# Patient Record
Sex: Male | Born: 1995 | Race: White | Hispanic: No | Marital: Single | Smoking: Never smoker
Health system: Southern US, Community
[De-identification: ages and names within clinical notes are randomized; demographics above are authoritative.]

---
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
## 2017-05-23 ENCOUNTER — Ambulatory Visit (INDEPENDENT_AMBULATORY_CARE_PROVIDER_SITE_OTHER): Payer: BLUE CROSS/BLUE SHIELD | Admitting: Physician Assistant

## 2017-05-23 VITALS — BP 121/70 | HR 86 | Temp 97.5°F | Ht 70.5 in | Wt 135.8 lb

## 2017-05-23 DIAGNOSIS — J0100 Acute maxillary sinusitis, unspecified: Secondary | ICD-10-CM

## 2017-05-23 MED ORDER — PREDNISONE 10 MG (21) PO TBPK: ORAL_TABLET | ORAL | 0 refills | Status: DC

## 2017-05-23 MED ORDER — AMOXICILLIN 500 MG PO CAPS: 1000.0000 mg | ORAL_CAPSULE | Freq: Two times a day (BID) | ORAL | 0 refills | Status: DC

## 2017-05-23 NOTE — Patient Instructions (Signed)
In a few days you may receive a survey in the mail or online from [employer] regarding your visit with us today. Please take a moment to fill this out. Your feedback is very important to our whole office. It can help us better understand your needs as well as improve your experience and satisfaction. Thank you for taking your time to complete it. We care about you.  Gotti Alwin, PA-C  

## 2017-05-23 NOTE — Progress Notes (Signed)
BP 121/70   Pulse 86   Temp (!) 97.5 F (36.4 C) (Oral)   Ht 5' 10.5" (1.791 m)   Wt 135 lb 12.8 oz (61.6 kg)   BMI 19.21 kg/m    Subjective:    Patient ID: Richard Livingston, male    DOB: 03-Dec-1995, 21 y.o.   MRN: [id number]  HPI: Richard Livingston is a 21 y.o. male presenting on 05/23/2017 for Cough (productive yellow phlegm, no known fever, 3-4 days, Zyrtec, robitusin cold/cough) and Wheezing (not breathing right)  This patient has had many days of sinus headache and postnasal drainage. There is copious drainage at times. Denies any fever at this time. There has been a history of sinus infections in the past.  No history of sinus surgery. There is cough at night. It has become more prevalent in recent days.   Relevant past medical, surgical, family and social history reviewed and updated as indicated. Allergies and medications reviewed and updated.  History reviewed. No pertinent past medical history.  History reviewed. No pertinent surgical history.  Review of Systems  Constitutional: Positive for fatigue. Negative for appetite change and fever.  HENT: Positive for congestion, postnasal drip, sinus pain, sinus pressure and sore throat.   Eyes: Negative.  Negative for pain and visual disturbance.  Respiratory: Positive for wheezing. Negative for cough, chest tightness and shortness of breath.   Cardiovascular: Negative.  Negative for chest pain, palpitations and leg swelling.  Gastrointestinal: Negative.  Negative for abdominal pain, diarrhea, nausea and vomiting.  Endocrine: Negative.   Genitourinary: Negative.   Musculoskeletal: Positive for back pain and myalgias.  Skin: Negative.  Negative for color change and rash.  Neurological: Positive for headaches. Negative for weakness and numbness.  Psychiatric/Behavioral: Negative.     Allergies as of 05/23/2017   No Known Allergies     Medication List       Accurate as of 05/23/17 11:03 AM. Always use your most recent med  list.          amoxicillin 500 MG capsule Commonly known as:  AMOXIL Take 2 capsules (1,000 mg total) by mouth 2 (two) times daily.   predniSONE 10 MG (21) Tbpk tablet Commonly known as:  STERAPRED UNI-PAK 21 TAB As directed x 6 days            Discharge Care Instructions        Start     Ordered   05/23/17 0000  amoxicillin (AMOXIL) 500 MG capsule  2 times daily    Question:  Supervising Provider  Answer:  Elenora Gamma   05/23/17 1102   05/23/17 0000  predniSONE (STERAPRED UNI-PAK 21 TAB) 10 MG (21) TBPK tablet    Question:  Supervising Provider  Answer:  Elenora Gamma   05/23/17 1102         Objective:    BP 121/70   Pulse 86   Temp (!) 97.5 F (36.4 C) (Oral)   Ht 5' 10.5" (1.791 m)   Wt 135 lb 12.8 oz (61.6 kg)   BMI 19.21 kg/m   No Known Allergies  Physical Exam  Constitutional: He is oriented to person, place, and time. He appears well-developed and well-nourished.  HENT:  Head: Normocephalic and atraumatic.  Right Ear: Tympanic membrane and external ear normal. No middle ear effusion.  Left Ear: Tympanic membrane and external ear normal.  No middle ear effusion.  Nose: Mucosal edema and rhinorrhea present. Right sinus exhibits no maxillary sinus tenderness. Left sinus  exhibits no maxillary sinus tenderness.  Mouth/Throat: Uvula is midline. Posterior oropharyngeal erythema present.  Eyes: Pupils are equal, round, and reactive to light. Conjunctivae and EOM are normal. Right eye exhibits no discharge. Left eye exhibits no discharge.  Neck: Normal range of motion.  Cardiovascular: Normal rate, regular rhythm and normal heart sounds.   Pulmonary/Chest: Effort normal and breath sounds normal. No respiratory distress. He has no wheezes.  Abdominal: Soft.  Lymphadenopathy:    He has no cervical adenopathy.  Neurological: He is alert and oriented to person, place, and time.  Skin: Skin is warm and dry.  Psychiatric: He has a normal mood and  affect.        Assessment & Plan:   1. Acute non-recurrent maxillary sinusitis - amoxicillin (AMOXIL) 500 MG capsule; Take 2 capsules (1,000 mg total) by mouth 2 (two) times daily.  Dispense: 40 capsule; Refill: 0    Current Outpatient Prescriptions:  .  amoxicillin (AMOXIL) 500 MG capsule, Take 2 capsules (1,000 mg total) by mouth 2 (two) times daily., Disp: 40 capsule, Rfl: 0 .  predniSONE (STERAPRED UNI-PAK 21 TAB) 10 MG (21) TBPK tablet, As directed x 6 days, Disp: 21 tablet, Rfl: 0 Continue all other maintenance medications as listed above.  Follow up plan: Return if symptoms worsen or fail to improve.  Educational handout given for survey  Remus Loffler PA-C [facility] [address]  [place], [place] [address] [phone]   05/23/2017, 11:03 AM

## 2017-07-03 ENCOUNTER — Ambulatory Visit: Payer: BLUE CROSS/BLUE SHIELD | Admitting: Family Medicine

## 2017-07-03 ENCOUNTER — Encounter: Payer: Self-pay | Admitting: Family Medicine

## 2017-07-03 VITALS — BP 128/85 | HR 98 | Temp 97.6°F | Ht 70.0 in | Wt 137.0 lb

## 2017-07-03 DIAGNOSIS — R51 Headache: Secondary | ICD-10-CM

## 2017-07-03 DIAGNOSIS — R519 Headache, unspecified: Secondary | ICD-10-CM

## 2017-07-03 NOTE — Progress Notes (Signed)
Subjective: CC: headache PCP: Dettinger, Fransisca Kaufmann, MD DGU:[id number] Richard Livingston is a 21 y.o. male, who is accompanied by his mother to today's visit. He is presenting to clinic today for:  Headache Patient reports acute onset of "strange head pains" about 1-2 weeks ago.  He notes that the first episode was located at the apex of his head.  He describes it as a sharp/quick stabbing sensation that resolved independently.  About a week went by and then he noticed this twice more at bedtime but in different locations of the head.  He has used Advil once.  He is not sure if this actually helped, as this sensation does not last more than a second or 2 and does not occur frequently.  Denies dizziness, nausea, vomiting, visual disturbance, photophobia, changes in smell or taste, weakness, numbness, tingling, slurred speech, balance issues.  No rhinorrhea or tearing.  He denies pain with eating or feeling fatigued with chewing.  No facial pain.  No rhinorrhea, ear pain, sore throat, cough, congestion.  Denies preceding trauma.  No sleep apnea symptoms.  Patient has never had a headache disorder in the past.  He is healthy, physically active and eats a well-balanced diet.  He gets at least 8-9 hours of sleep.  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  He does note that since onset of symptoms but he is more focused on whether or not he is having the sensations and sometimes feels like he is talking himself into feeling things that might not be there.  His mother denies family history of neurologic disorders, migraine headaches, early stroke or heart attack, brain aneurysms, brain tumors or cancers.  No Known Allergies No past medical history on file. No family history on file. No current outpatient medications on file.  Social Hx: non smoker.  Health Maintenance: Flu shot  ROS: Per HPI  Objective: Office vital signs reviewed. BP 128/85   Pulse 98   Temp 97.6 F (36.4 C)  (Oral)   Ht _0  (1.778 m)   Wt 137 lb (62.1 kg)   BMI 19.66 kg/m   Physical Examination:  General: Awake, alert, well nourished, well appearing male, No acute distress HEENT: Normal, no nuchal rigidity    Neck: No masses palpated. No lymphadenopathy    Ears: Tympanic membranes intact, normal light reflex, no erythema, no bulging    Eyes: PERRLA, extraocular membranes intact, sclera white, no ocular discharge    Nose: nasal turbinates moist, no nasal discharge    Throat: moist mucus membranes, no erythema, no tonsillar exudate.  Airway is patent Cardio: regular rate, +2 DP Pulm: no wheeze, normal work of breathing on room air Extremities: warm, well perfused, No edema, cyanosis or clubbing; +2 pulses bilaterally MSK: normal gait and normal station Skin: dry; intact; no rashes or lesions appreciated the scalp where patient was feeling pain. Neuro: 5/5 UE and LE Strength and light touch sensation grossly intact, normal heel walk, normal toe walk, normal tandem walk, negative Romberg, normal upper extremity and lower extremity cerebellar testing, alert and oriented x3 Psych: Mood stable, speech normal, affect appropriate, somewhat anxious appearing, does not appear to be responding to internal stimuli, thought process normal.  Depression screen [facility] 2/9 07/03/2017 10/04/2016 03/07/2016  Decreased Interest 3 0 0  Down, Depressed, Hopeless 3 0 0  PHQ - 2 Score 6 0 0   No flowsheet data found.  Assessment/ Plan: 21 y.o. male   1. Nonintractable headache,  unspecified chronicity pattern, unspecified headache type Symptoms are likely a normal variant.  Nothing in his history or on his exam to suggest cluster headache, migraine headache, headache related to sleep apnea, or meningitis.  He has no focal neurologic deficits on exam and is not having any neurologic deficits during episodes.  His sleep appears to be adequate.  His ENT exam was unremarkable.  Per history it appears that he is  hydrating well.  No preceding anxiety, depression or stressors.  He is now ruminating on initial episode.  No family history of pathologic intracranial processes, migraine headaches.   I did reassure him that based on his exam and history of presenting illness, there is a very low probability that pathologic intracranial process is occurring.  We will obtain basic metabolic labs per patient's request.  I did review with them that brain imaging at this point is not indicated and radiation would likely pose more risk to the patient than benefit in this low risk setting.  Will contact patient with results of his labs.  He does give verbal permission to release results to his mother whose phone number is on file.  Strict return precautions and reasons for emergent evaluation in the emergency department review with patient.  They voiced understanding and will follow-up as needed. - CBC with Differential - CMP14+EGFR   Orders Placed This Encounter  Procedures  . CBC with Differential  . CMP14+EGFR   No orders of the defined types were placed in this encounter.    Janora Norlander, DO [facility] [phone]

## 2017-07-04 LAB — CMP14+EGFR
ALBUMIN: 5.1 g/dL (ref 3.5–5.5)
ALT: 23 IU/L (ref 0–44)
AST: 21 IU/L (ref 0–40)
Albumin/Globulin Ratio: 2.3 — ABNORMAL HIGH (ref 1.2–2.2)
Alkaline Phosphatase: 80 IU/L (ref 39–117)
BILIRUBIN TOTAL: 0.6 mg/dL (ref 0.0–1.2)
BUN/Creatinine Ratio: 11 (ref 9–20)
BUN: 10 mg/dL (ref 6–20)
CHLORIDE: 98 mmol/L (ref 96–106)
CO2: 29 mmol/L (ref 20–29)
CREATININE: 0.9 mg/dL (ref 0.76–1.27)
Calcium: 10.1 mg/dL (ref 8.7–10.2)
GFR calc non Af Amer: 123 mL/min/{1.73_m2} (ref >59)
GFR, EST AFRICAN AMERICAN: 143 mL/min/{1.73_m2} (ref >59)
GLUCOSE: 88 mg/dL (ref 65–99)
Globulin, Total: 2.2 g/dL (ref 1.5–4.5)
Potassium: 4.2 mmol/L (ref 3.5–5.2)
Sodium: 143 mmol/L (ref 134–144)
TOTAL PROTEIN: 7.3 g/dL (ref 6.0–8.5)

## 2017-07-04 LAB — CBC WITH DIFFERENTIAL/PLATELET
BASOS ABS: 0 10*3/uL (ref 0.0–0.2)
Basos: 1 %
EOS (ABSOLUTE): 0.2 10*3/uL (ref 0.0–0.4)
Eos: 4 %
HEMOGLOBIN: 16.4 g/dL (ref 13.0–17.7)
Hematocrit: 46.2 % (ref 37.5–51.0)
IMMATURE GRANS (ABS): 0 10*3/uL (ref 0.0–0.1)
IMMATURE GRANULOCYTES: 0 %
LYMPHS: 33 %
Lymphocytes Absolute: 1.5 10*3/uL (ref 0.7–3.1)
MCH: 31.4 pg (ref 26.6–33.0)
MCHC: 35.5 g/dL (ref 31.5–35.7)
MCV: 89 fL (ref 79–97)
MONOCYTES: 9 %
Monocytes Absolute: 0.4 10*3/uL (ref 0.1–0.9)
NEUTROS ABS: 2.5 10*3/uL (ref 1.4–7.0)
NEUTROS PCT: 53 %
PLATELETS: 270 10*3/uL (ref 150–379)
RBC: 5.22 x10E6/uL (ref 4.14–5.80)
RDW: 13.6 % (ref 12.3–15.4)
WBC: 4.6 10*3/uL (ref 3.4–10.8)

## 2017-07-27 DIAGNOSIS — J018 Other acute sinusitis: Secondary | ICD-10-CM | POA: Diagnosis not present

## 2017-08-11 ENCOUNTER — Ambulatory Visit: Payer: BLUE CROSS/BLUE SHIELD | Admitting: Family Medicine

## 2017-08-11 ENCOUNTER — Encounter: Payer: Self-pay | Admitting: Family Medicine

## 2017-08-11 VITALS — BP 136/86 | HR 114 | Temp 99.2°F | Ht 70.0 in | Wt 141.0 lb

## 2017-08-11 DIAGNOSIS — J069 Acute upper respiratory infection, unspecified: Secondary | ICD-10-CM

## 2017-08-11 DIAGNOSIS — L710 Perioral dermatitis: Secondary | ICD-10-CM

## 2017-08-11 DIAGNOSIS — R509 Fever, unspecified: Secondary | ICD-10-CM

## 2017-08-11 LAB — VERITOR FLU A/B WAIVED
INFLUENZA A: NEGATIVE
INFLUENZA B: NEGATIVE

## 2017-08-11 LAB — CULTURE, GROUP A STREP

## 2017-08-11 LAB — RAPID STREP SCREEN (MED CTR MEBANE ONLY): Strep Gp A Ag, IA W/Reflex: NEGATIVE

## 2017-08-11 NOTE — Progress Notes (Signed)
Subjective: CC: URI symptoms PCP: Dettinger, Elige RadonJoshua A, MD WUJ:WJXBJYNHPI:Kyi Gallop is a 21 y.o. male presenting to clinic today for:  1. Cold symptoms  Patient reports nonproductive cough, rhinorrhea, congestion, fatigue that started 3 days ago.  Denies hemoptysis, sinus pressure, headache, SOB, dizziness, rash, nausea, vomiting, diarrhea, fevers, chills, myalgia, sick contacts, recent travel.  Patient has used Mucinex with moderate relief of symptoms.  No history of COPD or asthma.  No tobacco use/ exposure.  2. Dry skin Patient reports a long-standing history of perioral dryness.  He notes that he has a new lesion on his left upper eyelid.  He is not yet used anything for this.  He does note a history of eczema.  Denies purulence or drainage from the lesions.  No tenderness to palpation.  ROS: Per HPI  No Known Allergies No past medical history on file. No current outpatient medications on file. Social History   Socioeconomic History  . Marital status: Single    Spouse name: Not on file  . Number of children: Not on file  . Years of education: Not on file  . Highest education level: Not on file  Social Needs  . Financial resource strain: Not on file  . Food insecurity - worry: Not on file  . Food insecurity - inability: Not on file  . Transportation needs - medical: Not on file  . Transportation needs - non-medical: Not on file  Occupational History  . Not on file  Tobacco Use  . Smoking status: Never Smoker  . Smokeless tobacco: Never Used  Substance and Sexual Activity  . Alcohol use: No  . Drug use: No  . Sexual activity: Not on file  Other Topics Concern  . Not on file  Social History Narrative  . Not on file   No family history on file.  Objective: Office vital signs reviewed. BP 136/86   Pulse (!) 114   Temp 99.2 F (37.3 C) (Oral)   Ht 5\' 10"  (1.778 m)   Wt 141 lb (64 kg)   BMI 20.23 kg/m   Physical Examination:  General: Awake, alert, well nourished,  No acute distress HEENT: Normal    Neck: No masses palpated. + anterior cervical lymph node enlargement bilaterally    Ears: Tympanic membranes intact, normal light reflex, no erythema, no bulging    Eyes: PERRLA, extraocular membranes intact, sclera white, no ocular discharge    Nose: nasal turbinates moist, clear nasal discharge    Throat: moist mucus membranes, mild oropharyngeal erythema w/out significant tonsillar enlargement, no tonsillar exudate.  Airway is patent Cardio: regular rate and rhythm, S1S2 heard, no murmurs appreciated Pulm: clear to auscultation bilaterally, no wheezes, rhonchi or rales; normal work of breathing on room air Skin: Perioral dermatitis appreciated.  Small scaly patch also appreciated on the left upper eyelid.  Assessment/ Plan: 21 y.o. male   1. URI with cough and congestion Patient is nontoxic-appearing.  He does have slight increased heart rate.  I suspect that this is in the setting of low-grade fever.  Physical exam significant for bilateral enlargement of anterior cervical lymph nodes and mild oropharyngeal erythema without significant tonsillar enlargement.  Rapid strep and rapid flu were obtained which were both negative.  Supportive care recommended for what looks to be a viral upper respiratory infection.  Home care instructions were reviewed.  Handout provided. Strict return precautions and reasons for emergent evaluation in the emergency department review with patient.  He voiced understanding and will follow-up  as needed.  2. Febrile illness - Rapid Strep Screen (Not at [facility]) - Veritor Flu A/B Waived  3. Perioral dermatitis Skin care instructions were reviewed with the patient.  Handout was provided.  If persistent despite good skin care at home, could consider topical corticosteroid.  Follow up prn.   Orders Placed This Encounter  Procedures  . Rapid Strep Screen (Not at [facility])  . Veritor Flu A/B Waived    Order Specific Question:   Source     Answer:   nasal   No orders of the defined types were placed in this encounter.    Raliegh IpAshly M Izabellah Dadisman, DO [facility] [phone]

## 2017-08-11 NOTE — Patient Instructions (Addendum)
Your strep test and flu test were negative.    If you are having significant drainage, I do recommend that you consider getting Claritin over-the-counter and taking 1 tablet daily and to your symptoms are controlled.  You may continue the Mucinex if you find this helpful.  Tylenol or ibuprofen is fine to use for fevers, sore throat or aches.  The skin dryness around your mouth and on your eyelid looks to be something called a dermatitis which can be caused an allergic reaction/eczema.  Please see information below with regards to skin care.  It appears that you have a viral upper respiratory infection (cold).  Cold symptoms can last up to 2 weeks.    - Get plenty of rest and drink plenty of fluids. - Try to breathe moist air. Use a cold mist humidifier. - Consume warm fluids (soup or tea) to provide relief for a stuffy nose and to loosen phlegm. - For nasal stuffiness, try saline nasal spray or a Neti Pot. Afrin nasal spray can also be used but this product should not be used longer than 3 days or it will cause rebound nasal stuffiness (worsening nasal congestion). - For sore throat pain relief: suck on throat lozenges, hard candy or popsicles; gargle with warm salt water (1/4 tsp. salt per 8 oz. of water); and eat soft, bland foods. - Eat a well-balanced diet. If you cannot, ensure you are getting enough nutrients by taking a daily multivitamin. - Avoid dairy products, as they can thicken phlegm. - Avoid alcohol, as it impairs your body's immune system.  CONTACT YOUR DOCTOR IF YOU EXPERIENCE ANY OF THE FOLLOWING: - High fever - Ear pain - Sinus-type headache - Unusually severe cold symptoms - Cough that gets worse while other cold symptoms improve - Flare up of any chronic lung problem, such as asthma - Your symptoms persist longer than 2 weeks  Basic Skin Care Your skin plays an important role in keeping the entire body healthy.  Below are some tips on how to try and maximize skin health  from the outside in.  1) Bathe in mildly warm water every 1 to 3 days, followed by light drying and an application of a thick moisturizer cream or ointment, preferably one that comes in a tub. a. Fragrance free moisturizing bars or body washes are preferred such as Purpose, Cetaphil, Dove sensitive skin, Aveeno, [employer] Baby or Vanicream products. b. Use a fragrance free cream or ointment, not a lotion, such as plain petroleum jelly or Vaseline ointment, Aquaphor, Vanicream, Eucerin cream or a generic version, CeraVe Cream, Cetaphil Restoraderm, Aveeno Eczema Therapy and [employer] Baby Calming, among others. c. Children with very dry skin often need to put on these creams two, three or four times a day.  As much as possible, use these creams enough to keep the skin from looking dry. d. Consider using fragrance free/dye free detergent, such as Arm and Hammer for sensitive skin, Tide Free or All Free.   2) If I am prescribing a medication to go on the skin, the medicine goes on first to the areas that need it, followed by a thick cream as above to the entire body.  3) Wynelle LinkSun is a major cause of damage to the skin. a. I recommend sun protection for all of my patients. I prefer physical barriers such as hats with wide brims that cover the ears, long sleeve clothing with SPF protection including rash guards for swimming. These can be found seasonally at outdoor  clothing companies, [employer] and [employer] and online at Liz Cla[employer]ornewww.coolibar.com, www.uvskinz.com and BrideEmporium.nlwww.sunprecautions.com. Avoid peak sun between the hours of 10am to 3pm to minimize sun exposure.  b. I recommend sunscreen for all of my patients older than 186 months of age when in the sun, preferably with broad spectrum coverage and SPF 30 or higher.  i. For children, I recommend sunscreens that only contain titanium dioxide and/or zinc oxide in the active ingredients. These do not burn the eyes and appear to be safer than chemical sunscreens. These  sunscreens include zinc oxide paste found in the diaper section, Vanicream Broad Spectrum 50+, Aveeno Natural Mineral Protection, Neutrogena Pure and Free Baby, Johnson and MotorolaJohnson Baby Daily face and body lotion, Cit[employer]California Baby products, among others. ii. There is no such thing as waterproof sunscreen. All sunscreens should be reapplied after 60-80 minutes of wear.  iii. Spray on sunscreens often use chemical sunscreens which do protect against the sun. However, these can be difficult to apply correctly, especially if wind is present, and can be more likely to irritate the skin.  Long term effects of chemical sunscreens are also not fully known.

## 2017-10-17 DIAGNOSIS — R509 Fever, unspecified: Secondary | ICD-10-CM | POA: Diagnosis not present

## 2017-10-17 DIAGNOSIS — J069 Acute upper respiratory infection, unspecified: Secondary | ICD-10-CM | POA: Diagnosis not present

## 2017-12-01 DIAGNOSIS — H6983 Other specified disorders of Eustachian tube, bilateral: Secondary | ICD-10-CM | POA: Diagnosis not present

## 2018-05-26 DIAGNOSIS — J069 Acute upper respiratory infection, unspecified: Secondary | ICD-10-CM | POA: Diagnosis not present

## 2019-03-12 ENCOUNTER — Encounter (HOSPITAL_BASED_OUTPATIENT_CLINIC_OR_DEPARTMENT_OTHER): Payer: Self-pay | Admitting: Adult Health

## 2019-03-12 ENCOUNTER — Other Ambulatory Visit: Payer: Self-pay

## 2019-03-12 ENCOUNTER — Emergency Department (HOSPITAL_BASED_OUTPATIENT_CLINIC_OR_DEPARTMENT_OTHER)
Admission: EM | Admit: 2019-03-12 | Discharge: 2019-03-12 | Disposition: A | Discharge status: Home / Self Care | Payer: BC Managed Care – PPO | Attending: Emergency Medicine | Admitting: Emergency Medicine

## 2019-03-12 DIAGNOSIS — Y929 Unspecified place or not applicable: Secondary | ICD-10-CM | POA: Insufficient documentation

## 2019-03-12 DIAGNOSIS — X58XXXA Exposure to other specified factors, initial encounter: Secondary | ICD-10-CM | POA: Insufficient documentation

## 2019-03-12 DIAGNOSIS — Y9311 Activity, swimming: Secondary | ICD-10-CM | POA: Diagnosis not present

## 2019-03-12 DIAGNOSIS — Y999 Unspecified external cause status: Secondary | ICD-10-CM | POA: Insufficient documentation

## 2019-03-12 DIAGNOSIS — T700XXA Otitic barotrauma, initial encounter: Secondary | ICD-10-CM | POA: Insufficient documentation

## 2019-03-12 DIAGNOSIS — S09302A Unspecified injury of left middle and inner ear, initial encounter: Secondary | ICD-10-CM | POA: Diagnosis not present

## 2019-03-12 DIAGNOSIS — H7292 Unspecified perforation of tympanic membrane, left ear: Secondary | ICD-10-CM | POA: Diagnosis not present

## 2019-03-12 MED ORDER — KETOROLAC TROMETHAMINE 30 MG/ML IJ SOLN
30.0000 mg | Freq: Once | INTRAMUSCULAR | Status: AC
  Administered 2019-03-12: 30 mg via INTRAMUSCULAR
  Filled 2019-03-12: qty 1

## 2019-03-12 MED ORDER — OFLOXACIN 0.3 % OP SOLN
5.0000 [drp] | Freq: Every day | OPHTHALMIC | Status: DC
  Administered 2019-03-12: 5 [drp] via OTIC
  Filled 2019-03-12: qty 5

## 2019-03-12 MED ORDER — FLUTICASONE PROPIONATE 50 MCG/ACT NA SUSP: 2.0000 | Freq: Every day | NASAL | 0 refills | Status: DC

## 2019-03-12 MED ORDER — OFLOXACIN 0.3 % OT SOLN: 5.0000 [drp] | Freq: Two times a day (BID) | OTIC | 0 refills | Status: DC

## 2019-03-12 MED ORDER — IBUPROFEN 600 MG PO TABS: 600.0000 mg | ORAL_TABLET | Freq: Four times a day (QID) | ORAL | 0 refills | Status: DC | PRN

## 2019-03-12 NOTE — ED Provider Notes (Signed)
[facility] EMERGENCY DEPARTMENT Provider Note   CSN: [id number] Arrival date & time: 03/12/19  2141     History   Chief Complaint Chief Complaint  Patient presents with  . Otalgia    HPI Richard Livingston is a 23 y.o. male.     HPI  This is a 23 year old male who presents with left ear pain and drainage.  Patient reports that Richard Livingston was swimming earlier today when Richard Livingston dove deep and felt a pop.  Since that time Richard Livingston has had pain and drainage from the left ear.  Rates his pain 8 out of 10.  Richard Livingston is not taking anything for pain.  Drainage has been clear.  Richard Livingston reports decreased hearing from that ear.  History reviewed. No pertinent past medical history.  There are no active problems to display for this patient.   History reviewed. No pertinent surgical history.      Home Medications    Prior to Admission medications   Medication Sig Start Date End Date Taking? Authorizing Provider  fluticasone (FLONASE) 50 MCG/ACT nasal spray Place 2 sprays into both nostrils daily. 03/12/19   Aldean Pipe, Barbette Hair, MD  ibuprofen (ADVIL) 600 MG tablet Take 1 tablet (600 mg total) by mouth every 6 (six) hours as needed. 03/12/19   Tkai Large, Barbette Hair, MD  ofloxacin (FLOXIN) 0.3 % OTIC solution Place 5 drops into the left ear 2 (two) times daily. 03/12/19   Chamara Dyck, Barbette Hair, MD    Family History History reviewed. No pertinent family history.  Social History Social History   Tobacco Use  . Smoking status: Never Smoker  . Smokeless tobacco: Never Used  Substance Use Topics  . Alcohol use: No  . Drug use: No     Allergies   Patient has no known allergies.   Review of Systems Review of Systems  Constitutional: Negative for fever.  HENT: Positive for ear discharge and ear pain.   All other systems reviewed and are negative.    Physical Exam Updated Vital Signs BP (!) 136/96   Pulse 66   Temp 97.9 F (36.6 C) (Oral)   Resp 18   Ht 1.829 m (6')   Wt 70.3 kg   SpO2 98%    BMI 21.02 kg/m   Physical Exam Vitals signs and nursing note reviewed.  Constitutional:      Appearance: Richard Livingston is well-developed.  HENT:     Head: Normocephalic and atraumatic.     Right Ear: Hearing and tympanic membrane normal. Tympanic membrane is not perforated.     Left Ear: Decreased hearing noted. Tympanic membrane is perforated and erythematous.     Ears:   Neck:     Musculoskeletal: Neck supple.  Cardiovascular:     Rate and Rhythm: Normal rate and regular rhythm.  Pulmonary:     Effort: Pulmonary effort is normal. No respiratory distress.  Musculoskeletal:     Right lower leg: No edema.     Left lower leg: No edema.  Skin:    General: Skin is warm and dry.  Neurological:     Mental Status: Richard Livingston is alert and oriented to person, place, and time.  Psychiatric:        Mood and Affect: Mood normal.      ED Treatments / Results  Labs (all labs ordered are listed, but only abnormal results are displayed) Labs Reviewed - No data to display  EKG None  Radiology No results found.  Procedures Procedures (including critical care time)  Medications Ordered in ED Medications  ofloxacin (OCUFLOX) 0.3 % ophthalmic solution 5 drop (5 drops Left EAR Given 03/12/19 2318)  ketorolac (TORADOL) 30 MG/ML injection 30 mg (30 mg Intramuscular Given 03/12/19 2316)     Initial Impression / Assessment and Plan / ED Course  I have reviewed the triage vital signs and the nursing notes.  Pertinent labs & imaging results that were available during my care of the patient were reviewed by me and considered in my medical decision making (see chart for details).        Patient presents with perforated left TM.  Likely related to barotrauma from diving.  Richard Livingston otherwise nontoxic-appearing.  Patient given IM Toradol.  Instructed the patient regarding supportive care at home and keeping the ear dry.  Will place on antibiotic drops and Flonase to clear the middle ear.  ENT follow-up provided.    After history, exam, and medical workup I feel the patient has been appropriately medically screened and is safe for discharge home. Pertinent diagnoses were discussed with the patient. Patient was given return precautions.   Final Clinical Impressions(s) / ED Diagnoses   Final diagnoses:  Barotrauma, otic, initial encounter  Perforation of left tympanic membrane    ED Discharge Orders         Ordered    ofloxacin (FLOXIN) 0.3 % OTIC solution  2 times daily     03/12/19 2320    ibuprofen (ADVIL) 600 MG tablet  Every 6 hours PRN     03/12/19 2320    fluticasone (FLONASE) 50 MCG/ACT nasal spray  Daily     03/12/19 2320           Shon BatonHorton, Alonte Wulff F, MD 03/12/19 2325

## 2019-03-12 NOTE — ED Triage Notes (Signed)
While swimming, pt reports that his left ear began to hurt and popped and is now draining water and is very sore.

## 2019-03-12 NOTE — Discharge Instructions (Addendum)
You were seen today and found to have a ruptured eardrum.  Keep the ear clean and dry.  Use drops as prescribed.  Also use Flonase to clear the eustachian tubes.  Follow-up with ENT as needed.

## 2019-03-12 NOTE — ED Notes (Signed)
ED Provider at bedside. 

## 2019-03-14 DIAGNOSIS — T700XXA Otitic barotrauma, initial encounter: Secondary | ICD-10-CM | POA: Diagnosis not present

## 2019-04-04 DIAGNOSIS — H7292 Unspecified perforation of tympanic membrane, left ear: Secondary | ICD-10-CM | POA: Diagnosis not present

## 2019-04-12 ENCOUNTER — Other Ambulatory Visit: Payer: Self-pay

## 2019-04-12 DIAGNOSIS — Z20822 Contact with and (suspected) exposure to covid-19: Secondary | ICD-10-CM

## 2019-04-13 LAB — NOVEL CORONAVIRUS, NAA: SARS-CoV-2, NAA: NOT DETECTED

## 2019-05-16 ENCOUNTER — Other Ambulatory Visit: Payer: Self-pay

## 2019-05-16 DIAGNOSIS — Z20822 Contact with and (suspected) exposure to covid-19: Secondary | ICD-10-CM

## 2019-05-16 DIAGNOSIS — R6889 Other general symptoms and signs: Secondary | ICD-10-CM | POA: Diagnosis not present

## 2019-05-18 LAB — NOVEL CORONAVIRUS, NAA: SARS-CoV-2, NAA: NOT DETECTED

## 2019-05-20 DIAGNOSIS — Z20828 Contact with and (suspected) exposure to other viral communicable diseases: Secondary | ICD-10-CM | POA: Diagnosis not present

## 2019-05-20 DIAGNOSIS — Z7189 Other specified counseling: Secondary | ICD-10-CM | POA: Diagnosis not present

## 2019-06-01 ENCOUNTER — Other Ambulatory Visit: Payer: Self-pay

## 2019-06-01 DIAGNOSIS — Z20828 Contact with and (suspected) exposure to other viral communicable diseases: Secondary | ICD-10-CM | POA: Diagnosis not present

## 2019-06-01 DIAGNOSIS — Z20822 Contact with and (suspected) exposure to covid-19: Secondary | ICD-10-CM

## 2019-06-03 LAB — NOVEL CORONAVIRUS, NAA: SARS-CoV-2, NAA: NOT DETECTED

## 2019-06-09 DIAGNOSIS — Z20828 Contact with and (suspected) exposure to other viral communicable diseases: Secondary | ICD-10-CM | POA: Diagnosis not present

## 2019-06-09 DIAGNOSIS — Z03818 Encounter for observation for suspected exposure to other biological agents ruled out: Secondary | ICD-10-CM | POA: Diagnosis not present

## 2019-06-09 DIAGNOSIS — Z7189 Other specified counseling: Secondary | ICD-10-CM | POA: Diagnosis not present

## 2019-07-01 DIAGNOSIS — Z20828 Contact with and (suspected) exposure to other viral communicable diseases: Secondary | ICD-10-CM | POA: Diagnosis not present

## 2019-07-01 DIAGNOSIS — Z7189 Other specified counseling: Secondary | ICD-10-CM | POA: Diagnosis not present

## 2019-07-20 ENCOUNTER — Other Ambulatory Visit: Payer: Self-pay

## 2019-07-20 DIAGNOSIS — Z20822 Contact with and (suspected) exposure to covid-19: Secondary | ICD-10-CM

## 2019-07-22 LAB — NOVEL CORONAVIRUS, NAA: SARS-CoV-2, NAA: NOT DETECTED

## 2019-09-10 DIAGNOSIS — Z03818 Encounter for observation for suspected exposure to other biological agents ruled out: Secondary | ICD-10-CM | POA: Diagnosis not present

## 2019-09-10 DIAGNOSIS — Z20828 Contact with and (suspected) exposure to other viral communicable diseases: Secondary | ICD-10-CM | POA: Diagnosis not present

## 2019-12-12 DIAGNOSIS — B9689 Other specified bacterial agents as the cause of diseases classified elsewhere: Secondary | ICD-10-CM | POA: Diagnosis not present

## 2019-12-12 DIAGNOSIS — J329 Chronic sinusitis, unspecified: Secondary | ICD-10-CM | POA: Diagnosis not present

## 2020-05-21 DIAGNOSIS — B349 Viral infection, unspecified: Secondary | ICD-10-CM | POA: Diagnosis not present

## 2020-09-13 DIAGNOSIS — R059 Cough, unspecified: Secondary | ICD-10-CM | POA: Diagnosis not present

## 2020-09-13 DIAGNOSIS — Z6821 Body mass index (BMI) 21.0-21.9, adult: Secondary | ICD-10-CM | POA: Diagnosis not present

## 2020-09-13 DIAGNOSIS — J40 Bronchitis, not specified as acute or chronic: Secondary | ICD-10-CM | POA: Diagnosis not present

## 2020-09-14 ENCOUNTER — Encounter: Payer: Self-pay | Admitting: Family Medicine

## 2020-09-14 ENCOUNTER — Ambulatory Visit (INDEPENDENT_AMBULATORY_CARE_PROVIDER_SITE_OTHER): Payer: BC Managed Care – PPO | Admitting: Family Medicine

## 2020-09-14 DIAGNOSIS — U071 COVID-19: Secondary | ICD-10-CM | POA: Diagnosis not present

## 2020-09-14 NOTE — Progress Notes (Signed)
Virtual Visit via Telephone Note  I connected with Richard Livingston on 09/14/20 at 1:58 PM by telephone and verified that I am speaking with the correct person using two identifiers. Richard Livingston is currently located at home and nobody is currently with him during this visit. The provider, Gwenlyn Fudge, FNP is located in their office at time of visit.  I discussed the limitations, risks, security and privacy concerns of performing an evaluation and management service by telephone and the availability of in person appointments. I also discussed with the patient that there may be a patient responsible charge related to this service. The patient expressed understanding and agreed to proceed.  Subjective: PCP: Dettinger, Elige Radon, MD  Chief Complaint  Patient presents with  . Covid Positive   Patient complains of cough, head congestion, shortness of breath and weakness. Onset of symptoms was 5 days ago, gradually improving since that time. Patient reports the only symptom he is really still having is a cough at times. He is drinking plenty of fluids. Evaluation to date: at home COVID test positive. Treatment to date: antihistamines, cough suppressants, Prednisone, and Albuterol inhaler.  He does not smoke.    ROS: Per HPI  Current Outpatient Medications:  .  albuterol (VENTOLIN HFA) 108 (90 Base) MCG/ACT inhaler, Inhale 2 puffs into the lungs every 6 (six) hours as needed., Disp: , Rfl:  .  predniSONE (DELTASONE) 5 MG tablet, Take 6-5-4-3-2-1 po qd, Disp: , Rfl:  .  Cholecalciferol (VITAMIN D3) 50 MCG (2000 UT) TABS, , Disp: , Rfl:  .  [employer] VITAMIN C 1000 MG tablet, Take 1,000 mg by mouth 2 (two) times daily., Disp: , Rfl:  .  fluticasone (FLONASE) 50 MCG/ACT nasal spray, Place 2 sprays into both nostrils daily., Disp: 16 g, Rfl: 0 .  ibuprofen (ADVIL) 400 MG tablet, Take 400 mg by mouth 3 (three) times daily., Disp: , Rfl:  .  ibuprofen (ADVIL) 600 MG tablet, Take 1 tablet (600 mg  total) by mouth every 6 (six) hours as needed., Disp: 30 tablet, Rfl: 0 .  ofloxacin (FLOXIN) 0.3 % OTIC solution, Place 5 drops into the left ear 2 (two) times daily., Disp: 5 mL, Rfl: 0 .  Zinc Sulfate 220 (50 Zn) MG TABS, Take 1 tablet by mouth daily., Disp: , Rfl:   No Known Allergies History reviewed. No pertinent past medical history.  Observations/Objective: A&O  No respiratory distress or wheezing audible over the phone Mood, judgement, and thought processes all WNL  Assessment and Plan: 1. COVID-19 - Discussed symptom management. Continue current treatment prescribed by urgent care.    Follow Up Instructions:  I discussed the assessment and treatment plan with the patient. The patient was provided an opportunity to ask questions and all were answered. The patient agreed with the plan and demonstrated an understanding of the instructions.   The patient was advised to call back or seek an in-person evaluation if the symptoms worsen or if the condition fails to improve as anticipated.  The above assessment and management plan was discussed with the patient. The patient verbalized understanding of and has agreed to the management plan. Patient is aware to call the clinic if symptoms persist or worsen. Patient is aware when to return to the clinic for a follow-up visit. Patient educated on when it is appropriate to go to the emergency department.   Time call ended: 2:04 PM  I provided 8 minutes of non-face-to-face time during this encounter.  [name]  Richard Bene, MSN, APRN, FNP-C [facility] 09/14/20

## 2020-11-05 ENCOUNTER — Ambulatory Visit (INDEPENDENT_AMBULATORY_CARE_PROVIDER_SITE_OTHER): Payer: BC Managed Care – PPO | Admitting: Nurse Practitioner

## 2020-11-05 ENCOUNTER — Encounter: Payer: Self-pay | Admitting: Nurse Practitioner

## 2020-11-05 DIAGNOSIS — R0981 Nasal congestion: Secondary | ICD-10-CM

## 2020-11-05 MED ORDER — DM-GUAIFENESIN ER 30-600 MG PO TB12: 1.0000 | ORAL_TABLET | Freq: Two times a day (BID) | ORAL | 0 refills | Status: DC

## 2020-11-05 NOTE — Progress Notes (Signed)
   Virtual Visit via telephone Note Due to COVID-19 pandemic this visit was conducted virtually. This visit type was conducted due to national recommendations for restrictions regarding the COVID-19 Pandemic (e.g. social distancing, sheltering in place) in an effort to limit this patient's exposure and mitigate transmission in our community. All issues noted in this document were discussed and addressed.  A physical exam was not performed with this format.  I connected with Richard Livingston on 11/05/20 at 11:40 AM by telephone and verified that I am speaking with the correct person using two identifiers. Richard Livingston is currently located at home  during visit. The provider, Daryll Drown, NP is located in their office at time of visit.  I discussed the limitations, risks, security and privacy concerns of performing an evaluation and management service by telephone and the availability of in person appointments. I also discussed with the patient that there may be a patient responsible charge related to this service. The patient expressed understanding and agreed to proceed.   History and Present Illness:  Sinusitis This is a new problem. The current episode started in the past 7 days. The problem has been gradually worsening since onset. There has been no fever. The pain is moderate. Associated symptoms include congestion, coughing and headaches. Pertinent negatives include no chills. Past treatments include oral decongestants and spray decongestants. The treatment provided no relief.      Review of Systems  Constitutional: Negative for chills and fever.  HENT: Positive for congestion.   Respiratory: Positive for cough.   Cardiovascular: Negative.   Gastrointestinal: Negative.   Genitourinary: Negative.   Musculoskeletal: Negative.   Skin: Negative.   Neurological: Positive for headaches.  All other systems reviewed and are negative.    Observations/Objective: Televisit-patient  does note some to be in distress  Assessment and Plan:  Complaint of nasal congestion Symptoms of sinusitis not well managed patient was diagnosed positive for COVID-19 about a month ago.  Ongoing congestion, cough and headache.  No fever or chills, nausea /vomiting.  Guaifenesin for cough and congestion ordered Advised patient to follow-up with worsening symptoms, education provided to patient with printed handouts given. Rx sent to pharmacy..   Follow Up Instructions:  Follow-up with worsening or unresolved symptoms    I discussed the assessment and treatment plan with the patient. The patient was provided an opportunity to ask questions and all were answered. The patient agreed with the plan and demonstrated an understanding of the instructions.   The patient was advised to call back or seek an in-person evaluation if the symptoms worsen or if the condition fails to improve as anticipated.  The above assessment and management plan was discussed with the patient. The patient verbalized understanding of and has agreed to the management plan. Patient is aware to call the clinic if symptoms persist or worsen. Patient is aware when to return to the clinic for a follow-up visit. Patient educated on when it is appropriate to go to the emergency department.   Time call ended: 1150 AM  I provided 10 minutes of non-face-to-face time during this encounter.    Daryll Drown, NP

## 2020-11-05 NOTE — Assessment & Plan Note (Signed)
Symptoms of sinusitis not well managed patient was diagnosed positive for COVID-19 about a month ago.  Ongoing congestion, cough and headache.  No fever or chills, nausea /vomiting.  Guaifenesin for cough and congestion ordered Advised patient to follow-up with worsening symptoms, education provided to patient with printed handouts given. Rx sent to pharmacy.Marland Kitchen

## 2021-01-13 ENCOUNTER — Other Ambulatory Visit: Payer: Self-pay

## 2021-01-13 ENCOUNTER — Emergency Department (HOSPITAL_BASED_OUTPATIENT_CLINIC_OR_DEPARTMENT_OTHER)
Admission: EM | Admit: 2021-01-13 | Discharge: 2021-01-13 | Discharge status: Against Medical Advice | Payer: BC Managed Care – PPO

## 2021-01-13 DIAGNOSIS — Z20822 Contact with and (suspected) exposure to covid-19: Secondary | ICD-10-CM | POA: Diagnosis not present

## 2021-01-13 DIAGNOSIS — R059 Cough, unspecified: Secondary | ICD-10-CM | POA: Diagnosis not present

## 2021-01-13 DIAGNOSIS — T7840XD Allergy, unspecified, subsequent encounter: Secondary | ICD-10-CM | POA: Diagnosis not present

## 2021-01-13 DIAGNOSIS — R0989 Other specified symptoms and signs involving the circulatory and respiratory systems: Secondary | ICD-10-CM | POA: Diagnosis not present

## 2021-01-13 DIAGNOSIS — R062 Wheezing: Secondary | ICD-10-CM | POA: Diagnosis not present

## 2021-03-21 DIAGNOSIS — H01021 Squamous blepharitis right upper eyelid: Secondary | ICD-10-CM | POA: Diagnosis not present

## 2022-01-28 DIAGNOSIS — B349 Viral infection, unspecified: Secondary | ICD-10-CM | POA: Diagnosis not present

## 2022-01-28 DIAGNOSIS — Z6823 Body mass index (BMI) 23.0-23.9, adult: Secondary | ICD-10-CM | POA: Diagnosis not present

## 2022-01-28 DIAGNOSIS — R509 Fever, unspecified: Secondary | ICD-10-CM | POA: Diagnosis not present

## 2022-01-28 DIAGNOSIS — J029 Acute pharyngitis, unspecified: Secondary | ICD-10-CM | POA: Diagnosis not present

## 2022-01-30 ENCOUNTER — Encounter: Payer: Self-pay | Admitting: Gastroenterology

## 2022-01-31 DIAGNOSIS — R12 Heartburn: Secondary | ICD-10-CM | POA: Diagnosis not present

## 2022-01-31 DIAGNOSIS — J029 Acute pharyngitis, unspecified: Secondary | ICD-10-CM | POA: Diagnosis not present

## 2022-01-31 DIAGNOSIS — Z6820 Body mass index (BMI) 20.0-20.9, adult: Secondary | ICD-10-CM | POA: Diagnosis not present

## 2022-01-31 DIAGNOSIS — Z0130 Encounter for examination of blood pressure without abnormal findings: Secondary | ICD-10-CM | POA: Diagnosis not present

## 2022-03-03 ENCOUNTER — Ambulatory Visit: Payer: BC Managed Care – PPO | Admitting: Gastroenterology

## 2024-04-09 DIAGNOSIS — S46911A Strain of unspecified muscle, fascia and tendon at shoulder and upper arm level, right arm, initial encounter: Secondary | ICD-10-CM | POA: Diagnosis not present

## 2024-04-26 ENCOUNTER — Telehealth: Admitting: Physician Assistant

## 2024-04-26 DIAGNOSIS — J069 Acute upper respiratory infection, unspecified: Secondary | ICD-10-CM

## 2024-04-26 MED ORDER — BENZONATATE 100 MG PO CAPS: 100.0000 mg | ORAL_CAPSULE | Freq: Three times a day (TID) | ORAL | 0 refills | Status: AC | PRN

## 2024-04-26 NOTE — Patient Instructions (Signed)
 Richard Livingston, thank you for joining Savannha Welle, PA-C for today's virtual visit.  While this provider is not your primary care provider (PCP), if your PCP is located in our provider database this encounter information will be shared with them immediately following your visit.   A [facility] MyChart account gives you access to today's visit and all your visits, tests, and labs performed at [facility]  click here if you don't have a [facility] MyChart account or go to [email or web address]  Consent: (Patient) Richard Livingston provided verbal consent for this virtual visit at the beginning of the encounter.  Current Medications:  Current Outpatient Medications:    benzonatate  (TESSALON ) 100 MG capsule, Take 1 capsule (100 mg total) by mouth 3 (three) times daily as needed for up to 7 days for cough., Disp: 21 capsule, Rfl: 0   Medications ordered in this encounter:  Meds ordered this encounter  Medications   benzonatate  (TESSALON ) 100 MG capsule    Sig: Take 1 capsule (100 mg total) by mouth 3 (three) times daily as needed for up to 7 days for cough.    Dispense:  21 capsule    Refill:  0    Supervising Provider:   BLAISE ALEENE KIDD [[id number]]     *If you need refills on other medications prior to your next appointment, please contact your pharmacy*  Follow-Up: Call back or seek an in-person evaluation if the symptoms worsen or if the condition fails to improve as anticipated.   Virtual Care [phone]  Other Instructions Get rest and adequate sleep  Drink plenty of water, broth, and other clear fluids to stay hydrated.  Use a cool-mist humidifier or take steamy showers to relieve congestion.  Elevate the head of the bed to help with post nasal drainage Sip warm liquids, gargle with salt water, use lozenges, or suck on hard candy.  Use over-the-counter medications like acetaminophen (Tylenol) or ibuprofen  (Advil , Motrin ) as needed for fever and  pain Honey (for those over age 59) or cough drops can help alleviate cough symptoms.  Use saline nasal sprays or washes.  Over the counter mucinex , max strength ( blue and white box) to help loosen sinus congestion.  PCP follow up 3-5 days if persistent or worsening symptoms   Upper Respiratory Infection, Adult An upper respiratory infection (URI) affects the nose, throat, and upper airways that lead to the lungs. The most common type of URI is often called the common cold. URIs usually get better on their own, without medical treatment. What are the causes? A URI is caused by a germ (virus). You may catch these germs by: Breathing in droplets from an infected person's cough or sneeze. Touching something that has the germ on it (is contaminated) and then touching your mouth, nose, or eyes. What increases the risk? You are more likely to get a URI if: You are very young or very old. You have close contact with others, such as at work, school, or a health care facility. You smoke. You have long-term (chronic) heart or lung disease. You have a weakened disease-fighting system (immune system). You have nasal allergies or asthma. You have a lot of stress. You have poor nutrition. What are the signs or symptoms? Runny or stuffy (congested) nose. Cough. Sneezing. Sore throat. Headache. Feeling tired (fatigue). Fever. Not wanting to eat as much as usual. Pain in your forehead, behind your eyes, and over your cheekbones (sinus pain). Muscle aches. Redness or irritation of the eyes.  Pressure in the ears or face. How is this treated? URIs usually get better on their own within 7-10 days. Medicines cannot cure URIs, but your doctor may recommend certain medicines to help relieve symptoms, such as: Over-the-counter cold medicines. Medicines to reduce coughing (cough suppressants). Coughing is a type of defense against infection that helps to clear the nose, throat, windpipe, and lungs  (respiratory system). Take these medicines only as told by your doctor. Medicines to lower your fever. Follow these instructions at home: Activity Rest as needed. If you have a fever, stay home from work or school until your fever is gone, or until your doctor says you may return to work or school. You should stay home until you cannot spread the infection anymore (you are not contagious). Your doctor may have you wear a face mask so you have less risk of spreading the infection. Relieving symptoms Rinse your mouth often with salt water. To make salt water, dissolve -1 tsp (3-6 g) of salt in 1 cup (237 mL) of warm water. Use a cool-mist humidifier to add moisture to the air. This can help you breathe more easily. Eating and drinking  Drink enough fluid to keep your pee (urine) pale yellow. Eat soups and other clear broths. General instructions  Take over-the-counter and prescription medicines only as told by your doctor. Do not smoke or use any products that contain nicotine or tobacco. If you need help quitting, ask your doctor. Avoid being where people are smoking (avoid secondhand smoke). Stay up to date on all your shots (immunizations), and get the flu shot every year. Keep all follow-up visits. How to prevent the spread of infection to others  Wash your hands with soap and water for at least 20 seconds. If you cannot use soap and water, use hand sanitizer. Avoid touching your mouth, face, eyes, or nose. Cough or sneeze into a tissue or your sleeve or elbow. Do not cough or sneeze into your hand or into the air. Contact a doctor if: You are getting worse, not better. You have any of these: A fever or chills. Brown or red mucus in your nose. Yellow or brown fluid (discharge)coming from your nose. Pain in your face, especially when you bend forward. Swollen neck glands. Pain when you swallow. White areas in the back of your throat. Get help right away if: You have shortness  of breath that gets worse. You have very bad or constant: Headache. Ear pain. Pain in your forehead, behind your eyes, and over your cheekbones (sinus pain). Chest pain. You have long-lasting (chronic) lung disease along with any of these: Making high-pitched whistling sounds when you breathe, most often when you breathe out (wheezing). Long-lasting cough (more than 14 days). Coughing up blood. A change in your usual mucus. You have a stiff neck. You have changes in your: Vision. Hearing. Thinking. Mood. These symptoms may be an emergency. Get help right away. Call 911. Do not wait to see if the symptoms will go away. Do not drive yourself to the hospital. Summary An upper respiratory infection (URI) is caused by a germ (virus). The most common type of URI is often called the common cold. URIs usually get better within 7-10 days. Take over-the-counter and prescription medicines only as told by your doctor. This information is not intended to replace advice given to you by your health care provider. Make sure you discuss any questions you have with your health care provider. Document Revised: 03/13/2021 Document Reviewed: 03/13/2021 Elsevier  Patient Education  2024 [employer].   If you have been instructed to have an in-person evaluation today at a local Urgent Care facility, please use the link below. It will take you to a list of all of our available [facility], including address, phone number and hours of operation. Please do not delay care.  [facility]  If you or a family member do not have a primary care provider, use the link below to schedule a visit and establish care. When you choose a [facility] primary care physician or advanced practice provider, you gain a long-term partner in health. Find a Primary Care Provider  Learn more about [facility]'s in-office and virtual care options: [facility] - Get Care Now

## 2024-04-26 NOTE — Progress Notes (Signed)
 Virtual Visit Consent   Richard Livingston, you are scheduled for a virtual visit with a [facility] provider today. Just as with appointments in the office, your consent must be obtained to participate. Your consent will be active for this visit and any virtual visit you may have with one of our providers in the next 365 days. If you have a MyChart account, a copy of this consent can be sent to you electronically.  As this is a virtual visit, video technology does not allow for your provider to perform a traditional examination. This may limit your provider's ability to fully assess your condition. If your provider identifies any concerns that need to be evaluated in person or the need to arrange testing (such as labs, EKG, etc.), we will make arrangements to do so. Although advances in technology are sophisticated, we cannot ensure that it will always work on either your end or our end. If the connection with a video visit is poor, the visit may have to be switched to a telephone visit. With either a video or telephone visit, we are not always able to ensure that we have a secure connection.  By engaging in this virtual visit, you consent to the provision of healthcare and authorize for your insurance to be billed (if applicable) for the services provided during this visit. Depending on your insurance coverage, you may receive a charge related to this service.  I need to obtain your verbal consent now. Are you willing to proceed with your visit today? Richard Livingston has provided verbal consent on 04/26/2024 for a virtual visit (video or telephone). Richard Riolo, PA-C  Date: 04/26/2024 12:04 PM   Virtual Visit via Video Note   I, Richard Livingston, connected with  Richard Livingston  ([id number], [birth year]) on 04/26/24 at 11:45 AM EDT by a video-enabled telemedicine application and verified that I am speaking with the correct person using two identifiers.  Location: Patient: Virtual Visit Location Patient:  Home Provider: Virtual Visit Location Provider: Home Office   I discussed the limitations of evaluation and management by telemedicine and the availability of in person appointments. The patient expressed understanding and agreed to proceed.    History of Present Illness: Richard Livingston is a 28 y.o. who identifies as a male who was assigned male at birth, and is being seen today for for evaluation of sinus congestion. Reports sore throat, x 2 days. Associated with sinus congestion, coughing, increased sneezing. Tried OTC cough drops.Reports GF is sick with similar symptoms. No known covid exposure.  Denies fever, headaches, ear pain, chest pain, n,v.  No hx of smoking, reports asthma as a child.    HPI:  Problems:  Patient Active Problem List   Diagnosis Date Noted   Complaint of nasal congestion 11/05/2020    Allergies: No Known Allergies Medications:  Current Outpatient Medications:    benzonatate  (TESSALON ) 100 MG capsule, Take 1 capsule (100 mg total) by mouth 3 (three) times daily as needed for up to 7 days for cough., Disp: 21 capsule, Rfl: 0  Observations/Objective: Patient is well-developed, well-nourished in no acute distress. Patient appears nontoxic  Resting comfortably \ at home.  Head is normocephalic, atraumatic.  No labored breathing.  Self-reported pressure to palpation over the sinuses  Nasal congestion noted  Speech is clear and coherent with logical content.  Patient is alert and oriented at baseline.     Assessment and Plan: 1. Viral upper respiratory tract infection (Primary) - benzonatate  (TESSALON ) 100 MG capsule; Take  1 capsule (100 mg total) by mouth 3 (three) times daily as needed for up to 7 days for cough.  Dispense: 21 capsule; Refill: 0  I did advise patient to complete an at home COVID test. Results: will message on [facility]  Get rest and adequate sleep  Drink plenty of water, broth, and other clear fluids to stay hydrated.  Use a cool-mist  humidifier or take steamy showers to relieve congestion.  Elevate the head of the bed to help with post nasal drainage Sip warm liquids, gargle with salt water, use lozenges, or suck on hard candy.  Use over-the-counter medications like acetaminophen (Tylenol) or ibuprofen  (Advil , Motrin ) as needed for fever and pain Honey (for those over age 73) or cough drops can help alleviate cough symptoms.  Use saline nasal sprays or washes.  Over the counter mucinex , max strength ( blue and white box) to help loosen sinus congestion.    Follow Up Instructions: I discussed the assessment and treatment plan with the patient. The patient was provided an opportunity to ask questions and all were answered. The patient agreed with the plan and demonstrated an understanding of the instructions.  A copy of instructions were sent to the patient via MyChart unless otherwise noted below.    The patient was advised to call back or seek an in-person evaluation if the symptoms worsen or if the condition fails to improve as anticipated.    Richard Klinkner, PA-C
